# Patient Record
Sex: Male | Born: 1985 | Race: White | Hispanic: No | Marital: Married
Health system: Midwestern US, Community
[De-identification: ages and names within clinical notes are randomized; demographics above are authoritative.]

## PROBLEM LIST (undated history)

## (undated) DIAGNOSIS — M25562 Pain in left knee: Secondary | ICD-10-CM

## (undated) DIAGNOSIS — G8929 Other chronic pain: Secondary | ICD-10-CM

## (undated) DIAGNOSIS — M25561 Pain in right knee: Secondary | ICD-10-CM

## (undated) DIAGNOSIS — R52 Pain, unspecified: Secondary | ICD-10-CM

## (undated) DIAGNOSIS — M25511 Pain in right shoulder: Secondary | ICD-10-CM

---

## 2017-03-11 ENCOUNTER — Emergency Department: Admit: 2017-03-12 | Payer: BLUE CROSS/BLUE SHIELD | Primary: Family

## 2017-03-11 DIAGNOSIS — J101 Influenza due to other identified influenza virus with other respiratory manifestations: Secondary | ICD-10-CM

## 2017-03-11 NOTE — ED Provider Notes (Signed)
[facility]     Department of Emergency Medicine   ED  Provider Note  Admit Date/RoomTime: 03/11/2017  9:19 PM  ED Room: 30/30   Chief Complaint:   Cough and Fever    History of Present Illness   Source of history provided by:  patient.  History/Exam Limitations: none.       Henry Edwards is a 32 y.o. old male who has a past medical Hx of: History reviewed. No pertinent past medical history. presents to the emergency department by private vehicle, for fever, which began 2 day(s) prior to arrival.  The fever is described as: fevers up to 101 degrees and measured orally.  Since onset the symptoms have been persistent. His symptoms are associated with cough, muscle aches, nasal congestion and low back pain. There has been NO history of abdominal pain, appetite decrease, chest pain, dysuria, neck stiffness, rash or vomiting. He has been treated with acetaminophen prior to arrival, 5 hours.    .  ROS    Pertinent positives and negatives are stated within HPI, all other systems reviewed and are negative.    Past Surgical History:   Procedure Laterality Date   . SHOULDER ARTHROPLASTY Left    Social History:  reports that he has been smoking Cigarettes.  He has been smoking about 1.50 packs per day. He has never used smokeless tobacco.  Family History: family history is not on file.  Allergies: Patient has no known allergies.    Physical Exam           ED Triage Vitals [03/11/17 2116]   BP Temp Temp Source Pulse Resp SpO2 Height Weight   (!) 141/75 101.8 F (38.8 C) Oral 100 18 96 % 5\' 10"  (1.778 m) 160 lb (72.6 kg)     Oxygen Saturation Interpretation: Normal.    Constitutional:  Alert, development consistent with age.  HEENT:  NC/NT.  Airway patent.  Neck:  Normal ROM.  Supple.  Respiratory:  Clear to auscultation and breath sounds equal.  CV:  Regular rate and rhythm, normal heart sounds, without pathological murmurs, ectopy, gallops, or rubs.  GI: Abdomen Soft, nontender, good bowel sounds.  No firm or pulsatile  mass.  Back:  No costovertebral tenderness.  Integument:  Normal turgor.  Warm, dry, without visible rash, unless noted elsewhere.  Lymphatic: no lymphadenopathy noted  Neurological:  Oriented.  Motor functions intact.    Lab / Imaging Results   (All laboratory and radiology results have been personally reviewed by myself)  Labs:  Results for orders placed or performed during the hospital encounter of 03/11/17   Rapid influenza A/B antigens   Result Value Ref Range    Influenza A by PCR DETECTED (A) Not Detected    Influenza B by PCR Not Detected Not Detected   Urinalysis   Result Value Ref Range    Color, UA Yellow Straw/Yellow    Clarity, UA Clear Clear    Glucose, Ur Negative Negative mg/dL    Bilirubin Urine Negative Negative    Ketones, Urine Negative Negative mg/dL    Specific Gravity, UA >=1.030 1.005 - 1.030    Blood, Urine Negative Negative    pH, UA 5.5 5.0 - 9.0    Protein, UA Negative Negative mg/dL    Urobilinogen, Urine 0.2 <2.0 E.U./dL    Nitrite, Urine Negative Negative    Leukocyte Esterase, Urine Negative Negative     Imaging:  All Radiology results interpreted by Radiologist unless otherwise noted.  XR CHEST STANDARD (2 VW)   Final Result          ED Course / Medical Decision Making     Medications   ibuprofen (ADVIL;MOTRIN) tablet 800 mg (800 mg Oral Given 03/11/17 2129)        Re-Evaluations:  03/11/17      Time: 2200    Patient's symptoms are improving, reprots the motrin helped a little    Consultations:             None    Procedures:   none    MDM:  PT presents with fever and body aches for 2 days. No flu shot this year, mild cough. Positive flu A swab, Advised pt of progress of disease, rest and fluid intake, motrin and tylenol    Counseling:   I have spoken with the patient and discussed today's results, in addition to providing specific details for the plan of care and counseling regarding the diagnosis and prognosis and are agreeable with the plan.     Assessment      1. Influenza A New  Problem     This patient's ED course included: a personal history and physicial examination and re-evaluation prior to disposition    This patient has remained hemodynamically stable during their ED course.     Plan   Discharge to home.  Patient condition is stable.    New Medications     New Prescriptions    No medications on file     Electronically signed by Shelly FlattenLiza J Lakea Mittelman, PA-C   DD: 03/11/17  **This report was transcribed using voice recognition software. Every effort was made to ensure accuracy; however, inadvertent computerized transcription errors may be present.  END OF PROVIDER NOTE         Shelly FlattenLiza J Schuyler Behan, PA-C  03/11/17 2220

## 2017-03-12 ENCOUNTER — Inpatient Hospital Stay
Admit: 2017-03-12 | Discharge: 2017-03-12 | Disposition: A | Discharge status: Home / Self Care | Payer: BLUE CROSS/BLUE SHIELD

## 2017-03-12 LAB — URINALYSIS
Bilirubin Urine: NEGATIVE
Blood, Urine: NEGATIVE
Glucose, Ur: NEGATIVE mg/dL
Ketones, Urine: NEGATIVE mg/dL
Leukocyte Esterase, Urine: NEGATIVE
Nitrite, Urine: NEGATIVE
Protein, UA: NEGATIVE mg/dL
Specific Gravity, UA: >=1.03 (ref 1.005–1.030)
Urobilinogen, Urine: 0.2 E.U./dL (ref <2.0)
pH, UA: 5.5 (ref 5.0–9.0)

## 2017-03-12 LAB — RAPID INFLUENZA A/B ANTIGENS
Influenza A by PCR: DETECTED — AB
Influenza B by PCR: NOT DETECTED

## 2017-03-12 MED ORDER — IBUPROFEN 800 MG PO TABS
800 MG | Freq: Once | ORAL | Status: AC
Start: 2017-03-12 — End: 2017-03-11
  Administered 2017-03-12: 02:00:00 800 mg via ORAL

## 2017-03-12 MED FILL — IBUPROFEN 800 MG PO TABS: 800 MG | ORAL | Qty: 1

## 2017-10-01 ENCOUNTER — Emergency Department: Admit: 2017-10-01 | Payer: BLUE CROSS/BLUE SHIELD | Primary: Family

## 2017-10-01 ENCOUNTER — Inpatient Hospital Stay
Admit: 2017-10-01 | Discharge: 2017-10-02 | Disposition: A | Discharge status: Home / Self Care | Payer: BLUE CROSS/BLUE SHIELD | Attending: Emergency Medicine

## 2017-10-01 ENCOUNTER — Emergency Department: Admit: 2017-10-02 | Payer: BLUE CROSS/BLUE SHIELD | Primary: Family

## 2017-10-01 DIAGNOSIS — J40 Bronchitis, not specified as acute or chronic: Secondary | ICD-10-CM

## 2017-10-01 MED ORDER — ACETAMINOPHEN 500 MG PO TABS
500 MG | Freq: Once | ORAL | Status: AC
Start: 2017-10-01 — End: 2017-10-01
  Administered 2017-10-02: 1000 mg via ORAL

## 2017-10-01 MED ORDER — SODIUM CHLORIDE 0.9% BOLUS (FLUID RESUSCITATION)
0.9 % | Freq: Once | INTRAVENOUS | Status: AC
Start: 2017-10-01 — End: 2017-10-01
  Administered 2017-10-02: 1000 mL via INTRAVENOUS

## 2017-10-01 NOTE — ED Notes (Signed)
Assumed care of patient. Pt lying in bed in no apparent distress. Wife at bedside.     Bethanne GingerJanice Eddrick Dilone, RN  10/01/17 2005

## 2017-10-01 NOTE — ED Notes (Signed)
Bed: 02  Expected date:   Expected time:   Means of arrival:   Comments:  Sharlot GowdaWilson     Cenia Zaragosa A Nijel Flink, RN  10/01/17 [phone]

## 2017-10-01 NOTE — ED Triage Notes (Signed)
Pt arrived in ED with c/o chest pain throughout chest into ribs, lower back, kidneys, SOB with exertion, dry non productive cough, fever onset this morning with sweating and chills onset last night. Denies frequency, urgency, dysuria, diarrhea, constipation.  States his family has just gotten over a cold recently. Pt tried geneic nyquil and dayquil without relief. Wife at bedside.

## 2017-10-01 NOTE — ED Notes (Signed)
Bed: HALL-04  Expected date:   Expected time:   Means of arrival:   Comments:  Harrell GaveDeb     Jaidence Geisler, RN  10/01/17 [phone]

## 2017-10-01 NOTE — ED Provider Notes (Signed)
ED Attending  CC: No       Department of Emergency Medicine   ED  Provider Note  Admit Date/RoomTime: 10/01/2017  6:59 PM  ED Room: 02/02   Chief Complaint:   Chest Pain (Throughout chest into ribs. Pain present since waking up this morning. + Non productive Cough onset today. ) and Shortness of Breath (With exertion)    History of Present Illness   Source of history provided by:  patient.  History/Exam Limitations: none.       Henry Edwards is a 32 y.o. old male who has a past medical Hx of:   Past Medical History:   Diagnosis Date   . Rotator cuff disorder, left     presents to the emergency department ambulatory, for fever, which began 1 day(s) prior to arrival.  The fever is described as: sweats and chills.  Since onset the symptoms have been persistent. His symptoms are associated with abdominal pain. He has been treated with nothing prior to arrival.      ROS    Pertinent positives and negatives are stated within HPI, all other systems reviewed and are negative.    Past Surgical History:   Procedure Laterality Date   . SHOULDER ARTHROPLASTY Left    Social History:  reports that he has been smoking cigarettes. He has been smoking about 1.50 packs per day. He has never used smokeless tobacco. He reports that he drank alcohol. He reports that he does not use drugs.  Family History: family history is not on file.  Allergies: Patient has no known allergies.    Physical Exam           ED Triage Vitals [10/01/17 1845]   BP Temp Temp Source Pulse Resp SpO2 Height Weight   122/78 101.6 F (38.7 C) Oral 101 16 99 % 5\' 10"  (1.778 m) 170 lb (77.1 kg)     Oxygen Saturation Interpretation: Normal.    Constitutional:  Alert, development consistent with age.  HEENT:  NC/NT.  Airway patent.  Neck:  Normal ROM.  Supple.  Respiratory:  Clear to auscultation and breath sounds equal.  CV:  Tachycardia, normal heart sounds, without pathological murmurs, ectopy, gallops, or rubs.  GI: Abdomen Soft, LLQ tenderness to palpation,  good bowel sounds.  Back:  No costovertebral tenderness.  Integument:  Normal turgor.  Warm, dry, without visible rash, unless noted elsewhere.  Lymphatic: no lymphadenopathy noted  Neurological:  Oriented.  Motor functions intact.    Lab / Imaging Results   (All laboratory and radiology results have been personally reviewed by myself)  Labs:  Results for orders placed or performed during the hospital encounter of 10/01/17   CBC Auto Differential   Result Value Ref Range    WBC 7.4 4.5 - 11.5 E9/L    RBC 4.42 3.80 - 5.80 E12/L    Hemoglobin 13.9 12.5 - 16.5 g/dL    Hematocrit 86.5 78.4 - 54.0 %    MCV 88.5 80.0 - 99.9 fL    MCH 31.4 26.0 - 35.0 pg    MCHC 35.5 (H) 32.0 - 34.5 %    RDW 12.2 11.5 - 15.0 fL    Platelets 203 130 - 450 E9/L    MPV 8.9 7.0 - 12.0 fL    Neutrophils % 75.2 43.0 - 80.0 %    Immature Granulocytes % 0.3 0.0 - 5.0 %    Lymphocytes % 16.3 (L) 20.0 - 42.0 %    Monocytes % 8.0  2.0 - 12.0 %    Eosinophils % 0.1 0.0 - 6.0 %    Basophils % 0.1 0.0 - 2.0 %    Neutrophils # 5.57 1.80 - 7.30 E9/L    Immature Granulocytes # 0.02 E9/L    Lymphocytes # 1.21 (L) 1.50 - 4.00 E9/L    Monocytes # 0.59 0.10 - 0.95 E9/L    Eosinophils # 0.01 (L) 0.05 - 0.50 E9/L    Basophils # 0.01 0.00 - 0.20 E9/L   Comprehensive Metabolic Panel w/ Reflex to MG   Result Value Ref Range    Sodium 136 132 - 146 mmol/L    Potassium reflex Magnesium 3.8 3.5 - 5.0 mmol/L    Chloride 101 98 - 107 mmol/L    CO2 23 22 - 29 mmol/L    Anion Gap 12 7 - 16 mmol/L    Glucose 99 74 - 99 mg/dL    BUN 14 6 - 20 mg/dL    CREATININE 0.8 0.7 - 1.2 mg/dL    GFR Non-African American >60 >=60 mL/min/1.73    GFR African American >60     Calcium 8.8 8.6 - 10.2 mg/dL    Total Protein 7.2 6.4 - 8.3 g/dL    Alb 4.4 3.5 - 5.2 g/dL    Total Bilirubin 1.4 (H) 0.0 - 1.2 mg/dL    Alkaline Phosphatase 72 40 - 129 U/L    ALT 24 0 - 40 U/L    AST 26 0 - 39 U/L   Lipase   Result Value Ref Range    Lipase 28 13 - 60 U/L   Troponin   Result Value Ref Range     Troponin <0.01 0.00 - 0.03 ng/mL   D-Dimer, Quantitative   Result Value Ref Range    D-Dimer, Quant <200 ng/mL DDU   Urinalysis, reflex to microscopic   Result Value Ref Range    Color, UA Yellow Straw/Yellow    Clarity, UA Clear Clear    Glucose, Ur Negative Negative mg/dL    Bilirubin Urine Negative Negative    Ketones, Urine Negative Negative mg/dL    Specific Gravity, UA 1.020 1.005 - 1.030    Blood, Urine TRACE-LYSED Negative    pH, UA 6.0 5.0 - 9.0    Protein, UA Negative Negative mg/dL    Urobilinogen, Urine 1.0 <2.0 E.U./dL    Nitrite, Urine Negative Negative    Leukocyte Esterase, Urine Negative Negative   Lactic Acid, Plasma   Result Value Ref Range    Lactic Acid 0.6 0.5 - 2.2 mmol/L   Microscopic Urinalysis   Result Value Ref Range    WBC, UA NONE 0 - 5 /HPF    RBC, UA 0-1 0 - 2 /HPF    Bacteria, UA NONE /HPF   EKG 12 Lead   Result Value Ref Range    Ventricular Rate 97 BPM    Atrial Rate 97 BPM    P-R Interval 132 ms    QRS Duration 78 ms    Q-T Interval 316 ms    QTc Calculation (Bazett) 401 ms    P Axis 9 degrees    R Axis 19 degrees    T Axis 17 degrees     Imaging:  All Radiology results interpreted by Radiologist unless otherwise noted.  CT ABDOMEN PELVIS W IV CONTRAST Additional Contrast? None   Final Result      NO ACUTE PATHOLOGY SEEN IN ABDOMEN OR PELVIS  Right anterior lower pelvis extraperitoneal soft tissue density   suggesting of possible fat necrosis possibly from trauma.      Diverticulosis with no evidence of diverticulitis.         XR CHEST STANDARD (2 VW)   Final Result   NO ACUTE CARDIOPULMONARY PROCESS              ED Course / Medical Decision Making     Medications   0.9 % sodium chloride IV bolus 1,000 mL (0 mLs Intravenous Stopped 10/01/17 2302)   acetaminophen (TYLENOL) tablet 1,000 mg (1,000 mg Oral Given 10/01/17 2013)   iopamidol (ISOVUE-370) 76 % injection 110 mL (110 mLs Intravenous Given 10/01/17 2102)   sodium chloride flush 0.9 % injection 10 mL (10 mLs Intravenous  Given 10/01/17 2102)          Consultations:             None    Procedures:   none    MDM: Patient was evaluated by Dr. Margretta Ditty.  Labs were unremarkable.  UA was negative.  Chest x-ray was negative.  CT was unremarkable.  He is well-appearing in NAD and reports symptom improvement.  He should follow-up with his PCP within 3 days and return to the ER if symptoms worsen.      Counseling:   I have spoken with the patient and discussed today's results, in addition to providing specific details for the plan of care and counseling regarding the diagnosis and prognosis and are agreeable with the plan.     Assessment      1. Bronchitis    2. Cough      This patient's ED course included: a personal history and physicial examination and re-evaluation prior to disposition    This patient has remained hemodynamically stable and improved during their ED course.     Plan   Discharge to home.  Patient condition is stable.    New Medications     New Prescriptions    BENZONATATE (TESSALON PERLES) 100 MG CAPSULE    Take 1 capsule by mouth 3 times daily as needed for Cough    DOXYCYCLINE HYCLATE (VIBRA-TABS) 100 MG TABLET    Take 1 tablet by mouth 2 times daily for 7 days     Electronically signed by Artist Beach, APRN - CNP   DD: 10/01/17  **This report was transcribed using voice recognition software. Every effort was made to ensure accuracy; however, inadvertent computerized transcription errors may be present.     Artist Beach, APRN - CNP  10/01/17 2316

## 2017-10-01 NOTE — ED Notes (Signed)
Discharge instructions given and reviewed with patient and wife. RXs given. Instructed to follow up with CBroderik, NP. Questions and concerns addressed. Pt departed ED ambulatory in no apparent distress with wife. Personal belongings taken.     Bethanne Ginger, RN  10/01/17 2337

## 2017-10-02 LAB — CBC WITH AUTO DIFFERENTIAL
Basophils %: 0.1 % (ref 0.0–2.0)
Basophils Absolute: 0.01 E9/L (ref 0.00–0.20)
Eosinophils %: 0.1 % (ref 0.0–6.0)
Eosinophils Absolute: 0.01 E9/L — ABNORMAL LOW (ref 0.05–0.50)
Hematocrit: 39.1 % (ref 37.0–54.0)
Hemoglobin: 13.9 g/dL (ref 12.5–16.5)
Immature Granulocytes #: 0.02 E9/L
Immature Granulocytes %: 0.3 % (ref 0.0–5.0)
Lymphocytes %: 16.3 % — ABNORMAL LOW (ref 20.0–42.0)
Lymphocytes Absolute: 1.21 E9/L — ABNORMAL LOW (ref 1.50–4.00)
MCH: 31.4 pg (ref 26.0–35.0)
MCHC: 35.5 % — ABNORMAL HIGH (ref 32.0–34.5)
MCV: 88.5 fL (ref 80.0–99.9)
MPV: 8.9 fL (ref 7.0–12.0)
Monocytes %: 8 % (ref 2.0–12.0)
Monocytes Absolute: 0.59 E9/L (ref 0.10–0.95)
Neutrophils %: 75.2 % (ref 43.0–80.0)
Neutrophils Absolute: 5.57 E9/L (ref 1.80–7.30)
Platelets: 203 E9/L (ref 130–450)
RBC: 4.42 E12/L (ref 3.80–5.80)
RDW: 12.2 fL (ref 11.5–15.0)
WBC: 7.4 E9/L (ref 4.5–11.5)

## 2017-10-02 LAB — EKG 12-LEAD
Atrial Rate: 97 {beats}/min
P Axis: 9 degrees
P-R Interval: 132 ms
Q-T Interval: 316 ms
QRS Duration: 78 ms
QTc Calculation (Bazett): 401 ms
R Axis: 19 degrees
T Axis: 17 degrees
Ventricular Rate: 97 {beats}/min

## 2017-10-02 LAB — LACTIC ACID: Lactic Acid: 0.6 mmol/L (ref 0.5–2.2)

## 2017-10-02 LAB — COMPREHENSIVE METABOLIC PANEL W/ REFLEX TO MG FOR LOW K
ALT: 24 U/L (ref 0–40)
AST: 26 U/L (ref 0–39)
Albumin: 4.4 g/dL (ref 3.5–5.2)
Alkaline Phosphatase: 72 U/L (ref 40–129)
Anion Gap: 12 mmol/L (ref 7–16)
BUN: 14 mg/dL (ref 6–20)
CO2: 23 mmol/L (ref 22–29)
Calcium: 8.8 mg/dL (ref 8.6–10.2)
Chloride: 101 mmol/L (ref 98–107)
Creatinine: 0.8 mg/dL (ref 0.7–1.2)
GFR African American: >60
GFR Non-African American: >60 mL/min/{1.73_m2} (ref >=60)
Glucose: 99 mg/dL (ref 74–99)
Potassium reflex Magnesium: 3.8 mmol/L (ref 3.5–5.0)
Sodium: 136 mmol/L (ref 132–146)
Total Bilirubin: 1.4 mg/dL — ABNORMAL HIGH (ref 0.0–1.2)
Total Protein: 7.2 g/dL (ref 6.4–8.3)

## 2017-10-02 LAB — D-DIMER, QUANTITATIVE: D-Dimer, Quant: <200 ng/mL DDU

## 2017-10-02 LAB — URINALYSIS
Bilirubin Urine: NEGATIVE
Glucose, Ur: NEGATIVE mg/dL
Ketones, Urine: NEGATIVE mg/dL
Leukocyte Esterase, Urine: NEGATIVE
Nitrite, Urine: NEGATIVE
Protein, UA: NEGATIVE mg/dL
Specific Gravity, UA: 1.02 (ref 1.005–1.030)
Urobilinogen, Urine: 1 E.U./dL (ref <2.0)
pH, UA: 6 (ref 5.0–9.0)

## 2017-10-02 LAB — LIPASE: Lipase: 28 U/L (ref 13–60)

## 2017-10-02 LAB — MICROSCOPIC URINALYSIS

## 2017-10-02 LAB — TROPONIN: Troponin: <0.01 ng/mL (ref 0.00–0.03)

## 2017-10-02 MED ORDER — NORMAL SALINE FLUSH 0.9 % IV SOLN
0.9 % | Freq: Once | INTRAVENOUS | Status: AC
Start: 2017-10-02 — End: 2017-10-01
  Administered 2017-10-02: 01:00:00 10 mL via INTRAVENOUS

## 2017-10-02 MED ORDER — DOXYCYCLINE HYCLATE 100 MG PO TABS
100 MG | ORAL_TABLET | Freq: Two times a day (BID) | ORAL | 0 refills | Status: AC
Start: 2017-10-02 — End: 2017-10-08

## 2017-10-02 MED ORDER — BENZONATATE 100 MG PO CAPS
100 MG | ORAL_CAPSULE | Freq: Three times a day (TID) | ORAL | 0 refills | Status: AC | PRN
Start: 2017-10-02 — End: 2017-10-08

## 2017-10-02 MED ORDER — IOPAMIDOL 76 % IV SOLN
76 % | Freq: Once | INTRAVENOUS | Status: AC | PRN
Start: 2017-10-02 — End: 2017-10-01
  Administered 2017-10-02: 01:00:00 110 mL via INTRAVENOUS

## 2017-10-02 MED FILL — ACETAMINOPHEN EXTRA STRENGTH 500 MG PO TABS: 500 MG | ORAL | Qty: 2

## 2017-10-07 LAB — CULTURE BLOOD #1: Blood Culture, Routine: 5

## 2017-10-07 LAB — CULTURE, BLOOD 2: Culture, Blood 2: 5

## 2020-09-14 ENCOUNTER — Encounter

## 2020-10-05 ENCOUNTER — Inpatient Hospital Stay: Admit: 2020-10-05 | Payer: BLUE CROSS/BLUE SHIELD | Primary: Family

## 2020-10-05 DIAGNOSIS — M25561 Pain in right knee: Secondary | ICD-10-CM

## 2020-10-29 ENCOUNTER — Inpatient Hospital Stay
Admit: 2020-10-29 | Discharge: 2020-10-29 | Disposition: A | Discharge status: Home / Self Care | Payer: BLUE CROSS/BLUE SHIELD | Attending: Emergency Medicine

## 2020-10-29 ENCOUNTER — Emergency Department: Admit: 2020-10-29 | Payer: BLUE CROSS/BLUE SHIELD | Primary: Family

## 2020-10-29 DIAGNOSIS — K529 Noninfective gastroenteritis and colitis, unspecified: Secondary | ICD-10-CM

## 2020-10-29 LAB — BASIC METABOLIC PANEL W/ REFLEX TO MG FOR LOW K
Anion Gap: 9 mmol/L (ref 7–16)
BUN: 16 mg/dL (ref 6–20)
CO2: 25 mmol/L (ref 22–29)
Calcium: 9.6 mg/dL (ref 8.6–10.2)
Chloride: 104 mmol/L (ref 98–107)
Creatinine: 0.8 mg/dL (ref 0.7–1.2)
GFR African American: >60
GFR Non-African American: >60 mL/min/{1.73_m2} (ref >=60)
Glucose: 110 mg/dL — ABNORMAL HIGH (ref 74–99)
Potassium reflex Magnesium: 4.1 mmol/L (ref 3.5–5.0)
Sodium: 138 mmol/L (ref 132–146)

## 2020-10-29 LAB — CBC WITH AUTO DIFFERENTIAL
Basophils %: 0.1 % (ref 0.0–2.0)
Basophils Absolute: 0.01 E9/L (ref 0.00–0.20)
Eosinophils %: 0.6 % (ref 0.0–6.0)
Eosinophils Absolute: 0.06 E9/L (ref 0.05–0.50)
Hematocrit: 45.9 % (ref 37.0–54.0)
Hemoglobin: 15.5 g/dL (ref 12.5–16.5)
Immature Granulocytes #: 0.03 E9/L
Immature Granulocytes %: 0.3 % (ref 0.0–5.0)
Lymphocytes %: 14 % — ABNORMAL LOW (ref 20.0–42.0)
Lymphocytes Absolute: 1.44 E9/L — ABNORMAL LOW (ref 1.50–4.00)
MCH: 30.3 pg (ref 26.0–35.0)
MCHC: 33.8 % (ref 32.0–34.5)
MCV: 89.8 fL (ref 80.0–99.9)
MPV: 8.6 fL (ref 7.0–12.0)
Monocytes %: 5.1 % (ref 2.0–12.0)
Monocytes Absolute: 0.53 E9/L (ref 0.10–0.95)
Neutrophils %: 79.9 % (ref 43.0–80.0)
Neutrophils Absolute: 8.24 E9/L — ABNORMAL HIGH (ref 1.80–7.30)
Platelets: 288 E9/L (ref 130–450)
RBC: 5.11 E12/L (ref 3.80–5.80)
RDW: 12.7 fL (ref 11.5–15.0)
WBC: 10.3 E9/L (ref 4.5–11.5)

## 2020-10-29 LAB — HEPATIC FUNCTION PANEL
ALT: 15 U/L (ref 0–40)
AST: 19 U/L (ref 0–39)
Albumin: 4.9 g/dL (ref 3.5–5.2)
Alkaline Phosphatase: 84 U/L (ref 40–129)
Bilirubin, Direct: 0.2 mg/dL (ref 0.0–0.3)
Bilirubin, Indirect: 1.3 mg/dL — ABNORMAL HIGH (ref 0.0–1.0)
Total Bilirubin: 1.5 mg/dL — ABNORMAL HIGH (ref 0.0–1.2)
Total Protein: 7.8 g/dL (ref 6.4–8.3)

## 2020-10-29 LAB — LACTIC ACID: Lactic Acid: 1.1 mmol/L (ref 0.5–2.2)

## 2020-10-29 LAB — CLOSTRIDIUM DIFFICILE EIA: C.diff Toxin/Antigen: NOT DETECTED

## 2020-10-29 MED ORDER — SODIUM CHLORIDE 0.9 % IV BOLUS
0.9 % | Freq: Once | INTRAVENOUS | Status: AC
Start: 2020-10-29 — End: 2020-10-29
  Administered 2020-10-29: 14:00:00 1000 mL via INTRAVENOUS

## 2020-10-29 MED ORDER — ONDANSETRON 4 MG PO TBDP
4 MG | ORAL_TABLET | Freq: Three times a day (TID) | ORAL | 0 refills | Status: DC | PRN
Start: 2020-10-29 — End: 2020-10-29

## 2020-10-29 MED ORDER — IOPAMIDOL 76 % IV SOLN
76 % | Freq: Once | INTRAVENOUS | Status: AC | PRN
Start: 2020-10-29 — End: 2020-10-29
  Administered 2020-10-29: 14:00:00 75 mL via INTRAVENOUS

## 2020-10-29 MED ORDER — ONDANSETRON 4 MG PO TBDP
4 MG | ORAL_TABLET | Freq: Three times a day (TID) | ORAL | 0 refills | Status: AC | PRN
Start: 2020-10-29 — End: ?

## 2020-10-29 NOTE — ED Provider Notes (Signed)
HPI     Patient is a 35 y.o. male presents with a chief complaint of diarrhea.  Patient states for the last 28 hours he has had over "100" episodes of diarrhea.  He states he had 1 episode of vomiting this morning but that could be due to other people vomiting in the waiting room.  He states that there is no pain just a bloating sensation.  This has been occurring for 28 hours.  Patient states that it gets better with nothing.  Patient states that it gets worse with nothing.  Patient states that it is severe in severity.  Patient states it was acute in onset.      Review of Systems   Constitutional:  Positive for activity change, appetite change and fatigue. Negative for chills and fever.   HENT:  Negative for congestion and sore throat.    Eyes:  Negative for visual disturbance.   Respiratory:  Negative for apnea, cough, chest tightness, shortness of breath and wheezing.    Cardiovascular:  Negative for chest pain.   Gastrointestinal:  Positive for abdominal pain, diarrhea and vomiting. Negative for abdominal distention, constipation and nausea.   Endocrine: Negative for polyuria.   Genitourinary:  Negative for decreased urine volume, dysuria and urgency.   Musculoskeletal:  Negative for back pain.   Skin:  Negative for rash.   Neurological:  Negative for dizziness, weakness, light-headedness, numbness and headaches.      Physical Exam  Vitals and nursing note reviewed.   Constitutional:       General: He is not in acute distress.     Appearance: Normal appearance. He is not ill-appearing, toxic-appearing or diaphoretic.   HENT:      Head: Normocephalic and atraumatic.      Right Ear: External ear normal.      Left Ear: External ear normal.      Nose: Nose normal.      Mouth/Throat:      Mouth: Mucous membranes are moist.      Pharynx: No oropharyngeal exudate or posterior oropharyngeal erythema.   Eyes:      General: No scleral icterus.        Right eye: No discharge.         Left eye: No discharge.    Cardiovascular:      Rate and Rhythm: Normal rate and regular rhythm.      Heart sounds: No murmur heard.    No friction rub. No gallop.   Pulmonary:      Effort: Pulmonary effort is normal. No respiratory distress.      Breath sounds: No stridor. No wheezing, rhonchi or rales.   Abdominal:      General: Abdomen is flat. There is distension.      Tenderness: There is no abdominal tenderness. There is no right CVA tenderness, left CVA tenderness, guarding or rebound.   Musculoskeletal:         General: No swelling, tenderness or deformity.   Skin:     General: Skin is warm.      Capillary Refill: Capillary refill takes less than 2 seconds.      Coloration: Skin is not jaundiced.   Neurological:      General: No focal deficit present.      Mental Status: He is alert.   Psychiatric:         Mood and Affect: Mood normal.        Procedures     MDM  Patient  appeared to have a distended abdomen.  While he did not have any abdominal pain contrast CT of the abdomen was ordered to rule out any possible abdominal pathology.  Appendix was visualized normally.  There is no evidence of gallstones.  There was fluid within the small bowel and colon which is most likely due to to gastroenteritis.  Patient was also given a C. difficile test to rule out C. difficile.  Patient will be notified of results when received.  Patient was given prescription for Zofran and told to maintain adequate fluid intake.  Patient was agreeable to this plan.      Patient is a 35 y.o. male presenting with abdominal pain, diarrhea.      Patient was given return precautions. Labs were interpreted by me. Patient will follow up with their primary care provider. Patient is agreeable to this plan. Patient has remained stable throughout their stay in the ED.       Patient was seen and evaluated by myself and my attending Winn Jock, DO. Assessment and Plan discussed with attending provider, please see attestation for final plan of care.  This note was  done using dictation software and there may be some grammatical errors associated with this.    Liana Gerold, DO           --------------------------------------------- PAST HISTORY ---------------------------------------------  Past Medical History:  has a past medical history of Rotator cuff disorder, left.    Past Surgical History:  has a past surgical history that includes Total shoulder arthroplasty (Left).    Social History:  reports that he has been smoking cigarettes. He has been smoking an average of 1.5 packs per day. He has never used smokeless tobacco. He reports that he does not currently use alcohol. He reports that he does not use drugs.    Family History: family history is not on file.     The patient???s home medications have been reviewed.    Allergies: Patient has no known allergies.    -------------------------------------------------- RESULTS -------------------------------------------------  Labs:  Results for orders placed or performed during the hospital encounter of 10/29/20   CLOSTRIDIUM DIFFICILE EIA    Specimen: Stool   Result Value Ref Range    C.diff Toxin/Antigen       C. Difficile Toxin A and/or B NOT detected  Normal Range: Not detected     CBC with Auto Differential   Result Value Ref Range    WBC 10.3 4.5 - 11.5 E9/L    RBC 5.11 3.80 - 5.80 E12/L    Hemoglobin 15.5 12.5 - 16.5 g/dL    Hematocrit 36.6 44.0 - 54.0 %    MCV 89.8 80.0 - 99.9 fL    MCH 30.3 26.0 - 35.0 pg    MCHC 33.8 32.0 - 34.5 %    RDW 12.7 11.5 - 15.0 fL    Platelets 288 130 - 450 E9/L    MPV 8.6 7.0 - 12.0 fL    Neutrophils % 79.9 43.0 - 80.0 %    Immature Granulocytes % 0.3 0.0 - 5.0 %    Lymphocytes % 14.0 (L) 20.0 - 42.0 %    Monocytes % 5.1 2.0 - 12.0 %    Eosinophils % 0.6 0.0 - 6.0 %    Basophils % 0.1 0.0 - 2.0 %    Neutrophils Absolute 8.24 (H) 1.80 - 7.30 E9/L    Immature Granulocytes # 0.03 E9/L    Lymphocytes Absolute 1.44 (L) 1.50 - 4.00 E9/L  Monocytes Absolute 0.53 0.10 - 0.95 E9/L    Eosinophils  Absolute 0.06 0.05 - 0.50 E9/L    Basophils Absolute 0.01 0.00 - 0.20 E9/L   Basic Metabolic Panel w/ Reflex to MG   Result Value Ref Range    Sodium 138 132 - 146 mmol/L    Potassium reflex Magnesium 4.1 3.5 - 5.0 mmol/L    Chloride 104 98 - 107 mmol/L    CO2 25 22 - 29 mmol/L    Anion Gap 9 7 - 16 mmol/L    Glucose 110 (H) 74 - 99 mg/dL    BUN 16 6 - 20 mg/dL    Creatinine 0.8 0.7 - 1.2 mg/dL    GFR Non-African American >60 >=60 mL/min/1.73    GFR African American >60     Calcium 9.6 8.6 - 10.2 mg/dL   Hepatic Function Panel   Result Value Ref Range    Total Protein 7.8 6.4 - 8.3 g/dL    Albumin 4.9 3.5 - 5.2 g/dL    Alkaline Phosphatase 84 40 - 129 U/L    ALT 15 0 - 40 U/L    AST 19 0 - 39 U/L    Total Bilirubin 1.5 (H) 0.0 - 1.2 mg/dL    Bilirubin, Direct 0.2 0.0 - 0.3 mg/dL    Bilirubin, Indirect 1.3 (H) 0.0 - 1.0 mg/dL   Lactic Acid   Result Value Ref Range    Lactic Acid 1.1 0.5 - 2.2 mmol/L       Radiology:  CT ABDOMEN PELVIS W IV CONTRAST Additional Contrast? None   Final Result   Nonspecific fluid within the small bowel and colon could be secondary to   gastroenteritis.             ------------------------- NURSING NOTES AND VITALS REVIEWED ---------------------------  Date / Time Roomed:  10/29/2020  7:18 AM  ED Bed Assignment:  OTF/OTF    The nursing notes within the ED encounter and vital signs as below have been reviewed.   BP (!) 158/88    Pulse 73    Temp 98.2 ??F (36.8 ??C) (Oral)    Resp 18    Ht 5\' 11"  (1.803 m)    Wt 170 lb (77.1 kg)    SpO2 100%    BMI 23.71 kg/m??   Oxygen Saturation Interpretation: Normal      ------------------------------------------ PROGRESS NOTES ------------------------------------------  11:09 AM EDT  I have spoken with the patient and family if present and discussed today???s results, in addition to providing specific details for the plan of care and counseling regarding the diagnosis and prognosis.  Their questions are answered at this time and they are agreeable with the  plan. I discussed at length with them reasons for immediate return here for re evaluation. They will followup with their primary care provider by calling their office as soon as possible.      --------------------------------- ADDITIONAL PROVIDER NOTES ---------------------------------  At this time the patient is without objective evidence of an acute process requiring hospitalization or inpatient management.  They have remained hemodynamically stable throughout their entire ED visit and are stable for discharge with outpatient follow-up.     The plan has been discussed in detail and they are aware of the specific conditions for emergent return, as well as the importance of follow-up.      Discharge Medication List as of 10/29/2020 10:44 AM      Zofran ODT 4mg     Diagnosis:  1. Gastroenteritis  Disposition:  Patient's disposition: Discharge to home  Patient's condition is stable.      Liana Gerold, DO  Resident  10/29/20 1109       Liana Gerold, DO  Resident  10/29/20 [phone]

## 2021-02-21 IMAGING — MR MRI TIB/FIB LT WO CONTRAST
19 of 21 series · 38 of 40 positions shown · non-contrast
Comparison: none

﻿Pertinent Hx:  Fell 01/29/2021.  Knee pain and leg pain.  doi 12.20.22
TECHNIQUE: Images were taken in axial, coronal and sagittal planes.  T1 and T2-weighted imaging was performed.

[Series 5: PD fat-sat · coronal · 5.0mm · 0.73mm/px · 1 of 24 slices shown (1 of 8)]
[im 1/24]
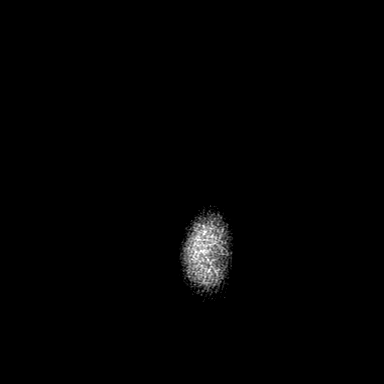

[Series 6: PD fat-sat · coronal · 5.0mm · 0.88mm/px · 1 of 24 slices shown (2 of 8)]
[im 1/24]
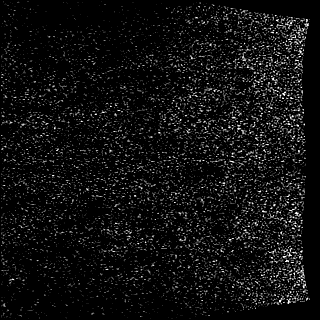

[Series 7: T2 · coronal · 5.0mm · 0.88mm/px · 1 of 24 slices shown (1 of 4)]
[im 1/24]
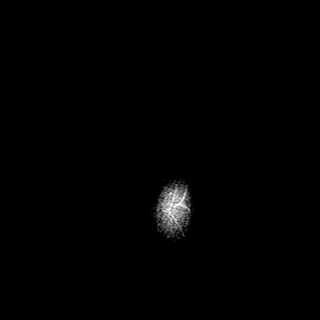

[Series 8: T2 · coronal · 5.0mm · 0.88mm/px · 1 of 24 slices shown (2 of 4)]
[im 1/24]
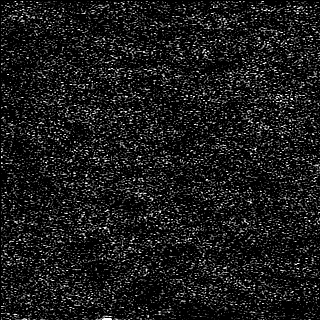

[Series 9: T2 · sagittal · 4.0mm · 1.17mm/px · 1 of 26 slices shown (3 of 4)]
[im 1/26]
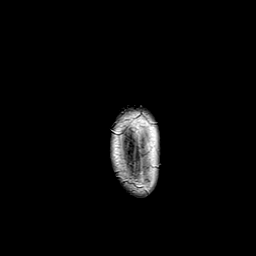

[Series 10: T2 · sagittal · 4.0mm · 1.17mm/px · 1 of 26 slices shown (4 of 4)]
[im 1/26]
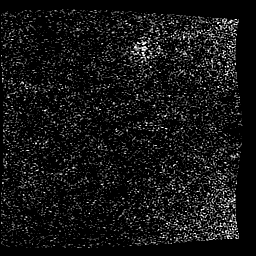

[Series 11: PD fat-sat · sagittal · 4.0mm · 1.17mm/px · 2 of 26 slices shown (3 of 8)]
[im 1/26]
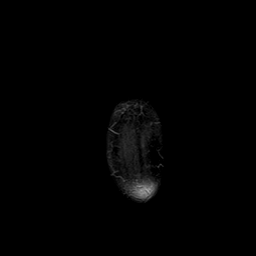
[im 26/26]
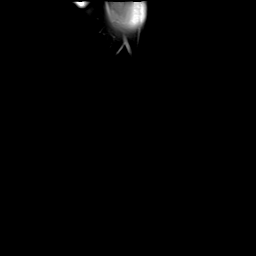

[Series 12: PD fat-sat · sagittal · 4.0mm · 1.17mm/px · 2 of 26 slices shown (4 of 8)]
[im 1/26]
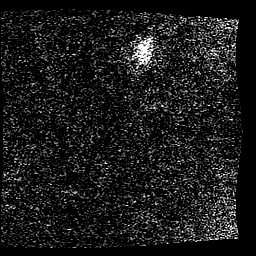
[im 26/26]
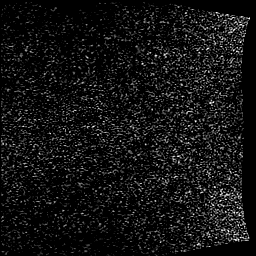

[Series 13: T2 fat-sat · axial · 5.0mm · 0.70mm/px · z∈[-255,+39]mm · 3 of 48 slices shown (1 of 2)]
[im 1/48]
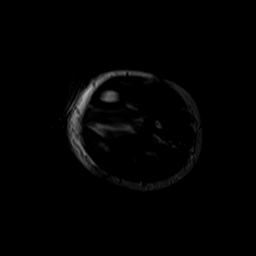
[im 24/48]
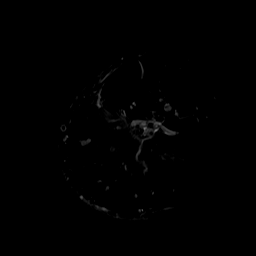
[im 48/48]
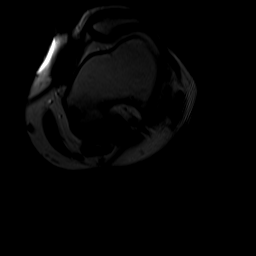

[Series 14: T2 fat-sat · axial · 5.0mm · 0.70mm/px · z∈[-405,-171]mm · 3 of 40 slices shown (2 of 2)]
[im 1/40]
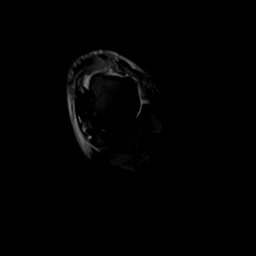
[im 20/40]
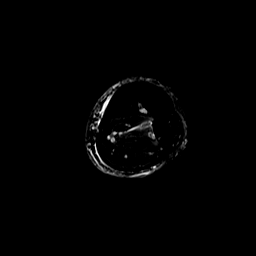
[im 40/40]
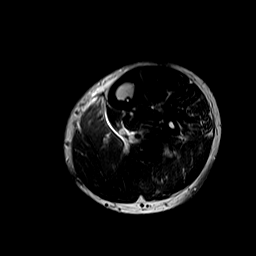

[Series 15: T1 · axial · 5.0mm · 0.70mm/px · z∈[-255,+39]mm · 3 of 50 slices shown (1 of 2)]
[im 1/50]
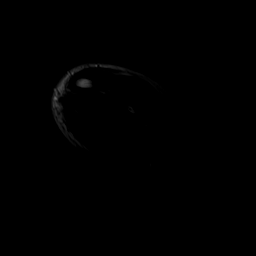
[im 25/50]
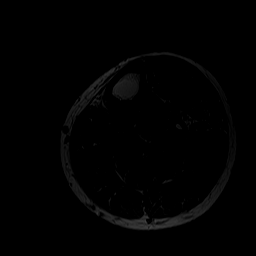
[im 50/50]
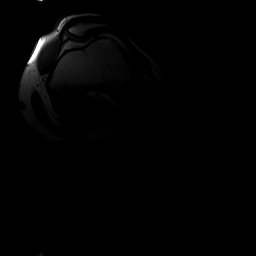

[Series 16: T1 · axial · 5.0mm · 0.70mm/px · z∈[-405,-171]mm · 3 of 40 slices shown (2 of 2)]
[im 1/40]
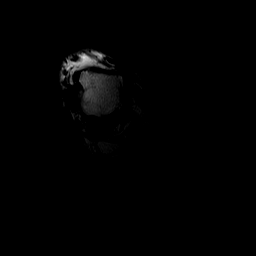
[im 20/40]
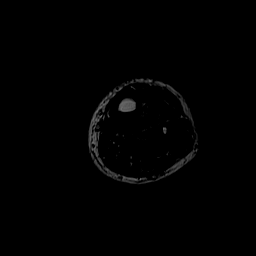
[im 40/40]
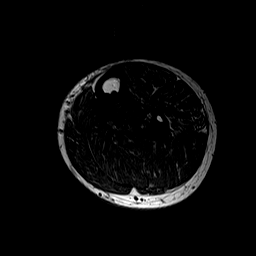

[Series 17: PD fat-sat · axial · 5.0mm · 0.70mm/px · z∈[-255,+39]mm · 3 of 50 slices shown (5 of 8)]
[im 1/50]
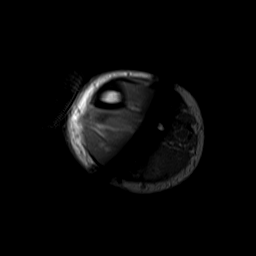
[im 25/50]
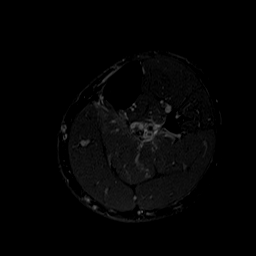
[im 50/50]
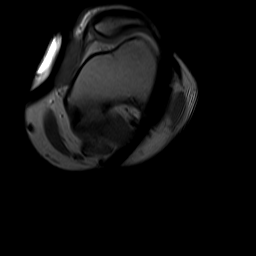

[Series 18: PD fat-sat · axial · 5.0mm · 0.70mm/px · z∈[-405,-171]mm · 3 of 40 slices shown (6 of 8)]
[im 1/40]
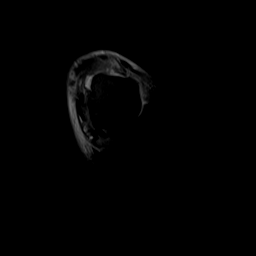
[im 20/40]
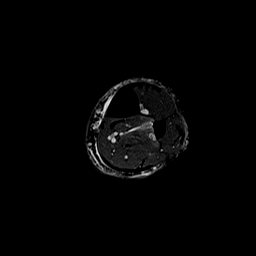
[im 40/40]
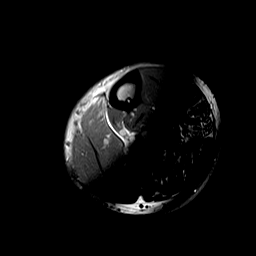

[Series 19: PD fat-sat · sagittal · 4.0mm · 1.17mm/px · 2 of 26 slices shown (7 of 8)]
[im 1/26]
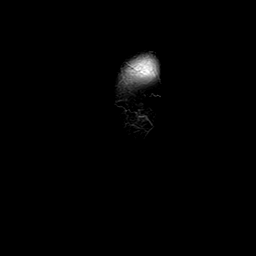
[im 26/26]
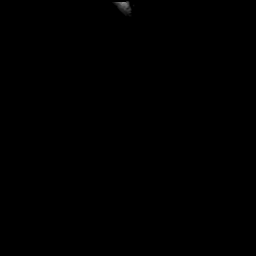

[Series 20: PD fat-sat · coronal · 5.0mm · 0.73mm/px · 2 of 24 slices shown (8 of 8)]
[im 1/24]
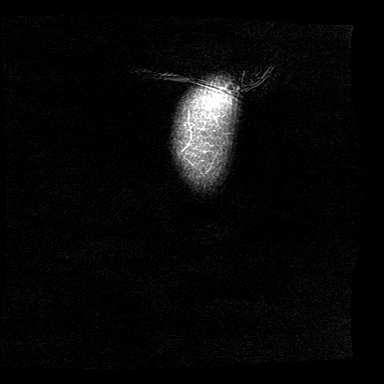
[im 24/24]
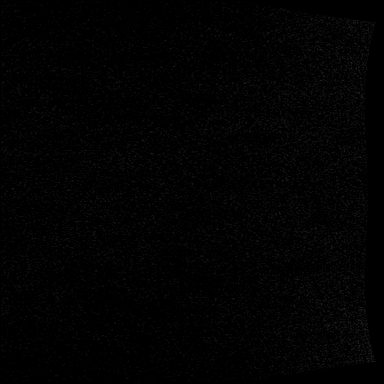

[Series 100: (id) · sagittal · 5.0mm · 1.17mm/px · 2 of 25 slices shown]
[im 1/25]
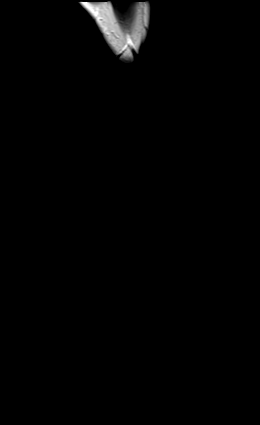
[im 25/25]
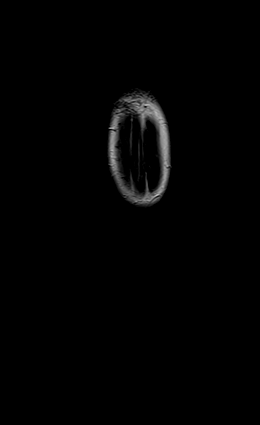

[Series 101: pdcor · coronal · 6.2mm · 0.73mm/px · 2 of 24 slices shown]
[im 1/24]
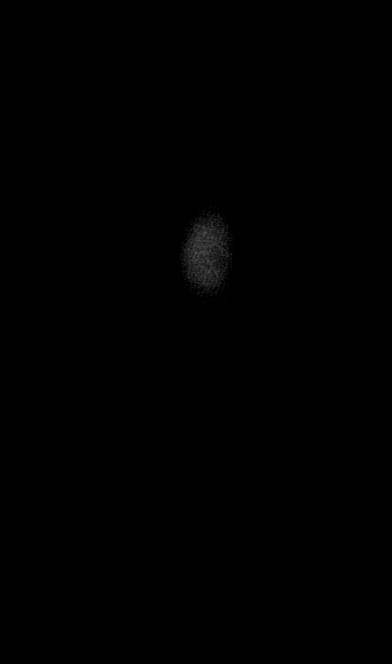
[im 24/24]
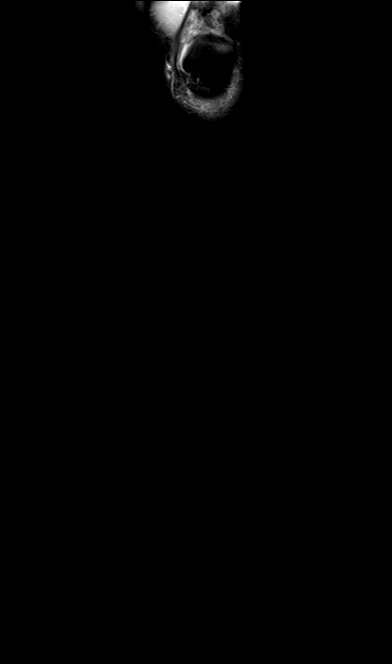

[Series 102: pdsag · sagittal · 5.0mm · 1.17mm/px · 2 of 25 slices shown]
[im 1/25]
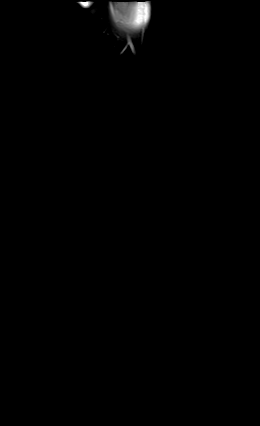
[im 25/25]
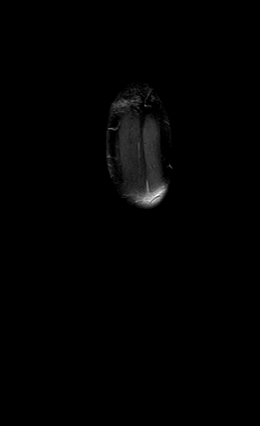

[38 of 40 positions shown; findings below may reference images not displayed]

FINDINGS: There is bone marrow edema in the proximal tibia laterally beneath the lateral tibial plateau and extending to the lateral edge of the bone.  No fracture can be identified.  Findings are consistent with bone contusion.  

The tibia and fibula otherwise appear normal.  No other abnormality identified.  

There are areas of soft tissue edema within the mid portion of the calf.  These involve portions of the soleus as well as the flexor hallucis longus and flexor digitorum muscles.  No specific muscle or tendon tear can be identified.  

Other soft tissues of the leg appear normal.  

No other abnormality can be identified.
IMPRESSION: 1. Bone contusion proximal tibia.  There is bone marrow edema within the proximal tibia laterally beneath the lateral tibial plateau and extending laterally to the edge of the bone.  No fracture can be identified.  

2. Soft tissue contusion with areas of edema within several muscles of the calf.  There is some edema within portions of the soleus as well as the flexor hallucis longus and flexor digitorum muscles without evidence of specific muscle or tendon tear. 

3. No other abnormality identified.

## 2021-02-21 IMAGING — MR MRI KNEE LT W/O CONTRAST
5 of 6 series · 37 of 40 positions shown · non-contrast
Comparison: none

﻿Pertinent Hx:  Fell.  Knee pain.  Injury 01/29/2021.
TECHNIQUE: Proton density and T2-weighted sagittal images of the knee were taken.  Proton density coronal and axial images were also obtained.

[Series 3: PD fat-sat · axial · 3.0mm · 0.50mm/px · z∈[-86,+62]mm · 9 of 38 slices shown (1 of 3)]
[im 1/38]
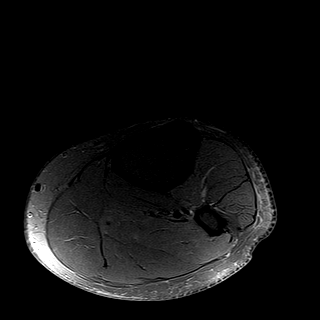
[im 5/38]
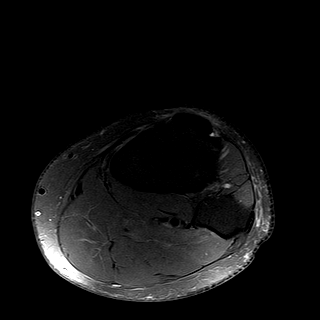
[im 10/38]
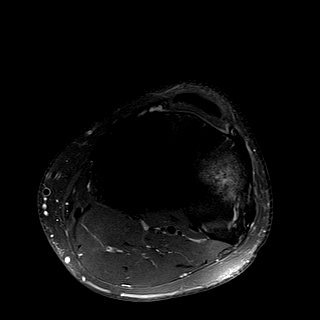
[im 14/38]
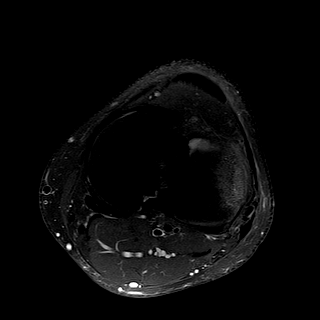
[im 19/38]
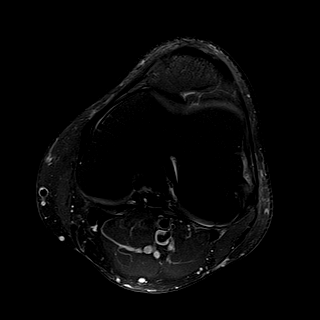
[im 24/38]
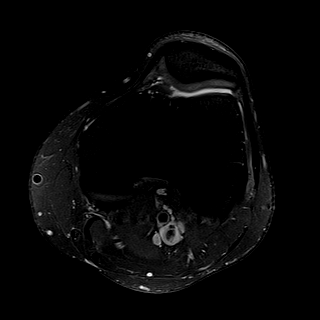
[im 28/38]
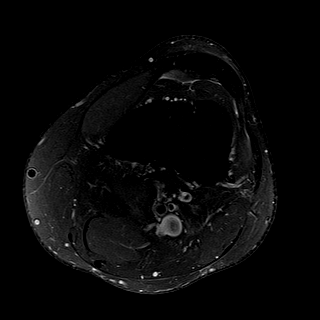
[im 33/38]
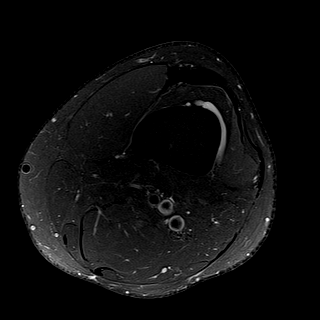
[im 38/38]
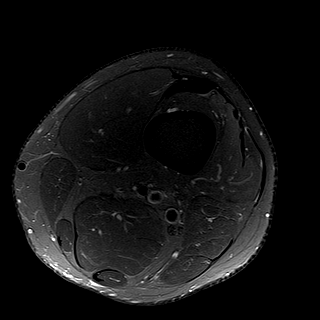

[Series 4: PD fat-sat · sagittal · 3.0mm · 0.50mm/px · 7 of 28 slices shown (2 of 3)]
[im 1/28]
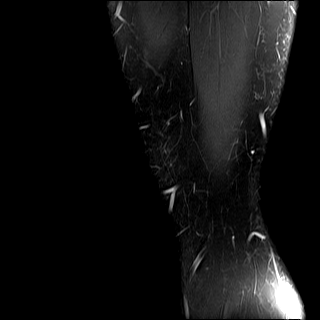
[im 5/28]
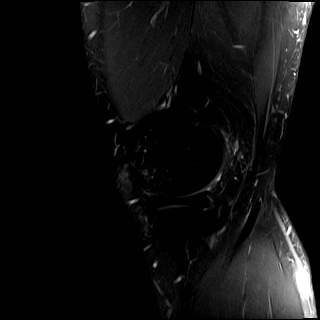
[im 10/28]
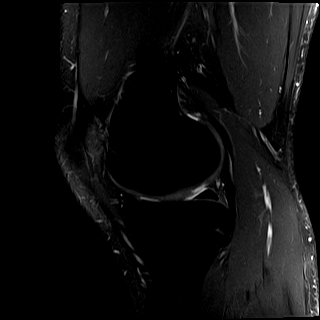
[im 14/28]
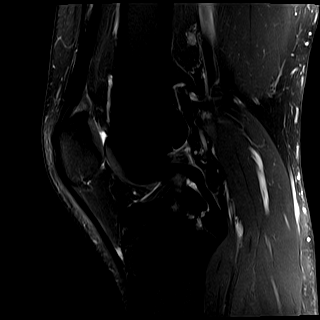
[im 19/28]
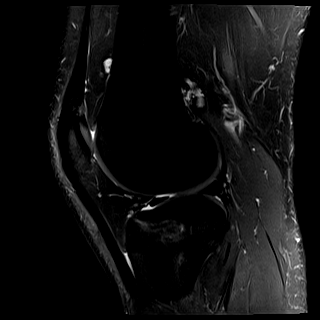
[im 23/28]
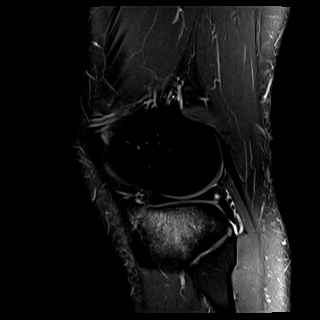
[im 28/28]
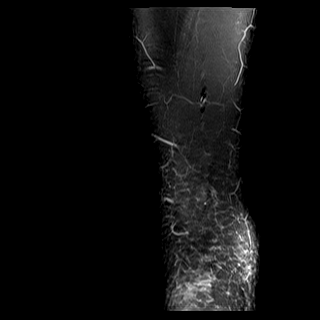

[Series 5: T2 · sagittal · 3.0mm · 0.42mm/px · 7 of 28 slices shown]
[im 1/28]
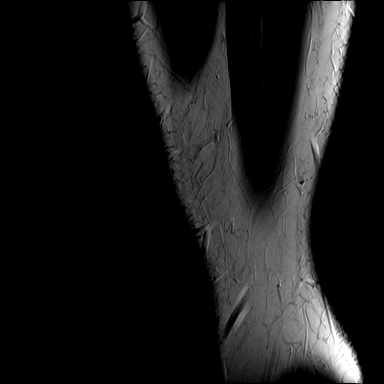
[im 5/28]
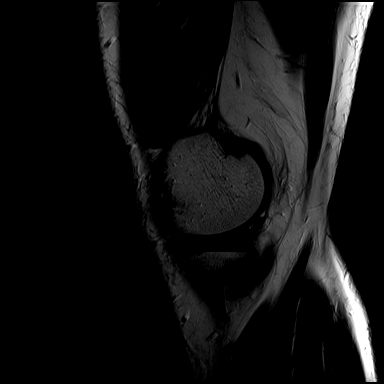
[im 10/28]
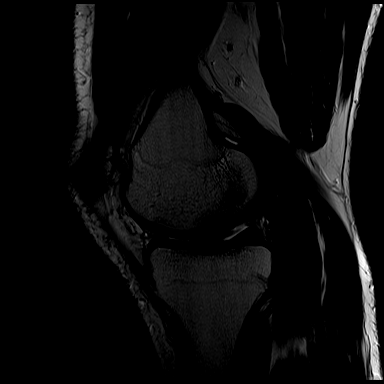
[im 14/28]
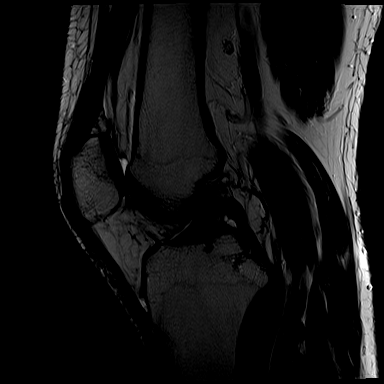
[im 19/28]
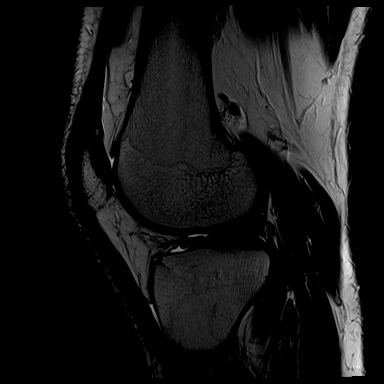
[im 23/28]
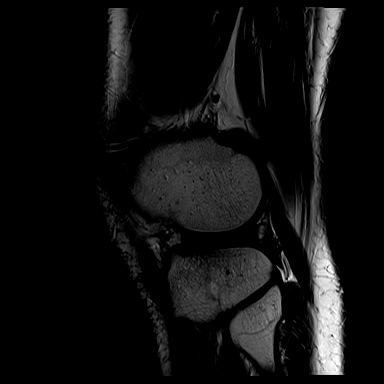
[im 28/28]
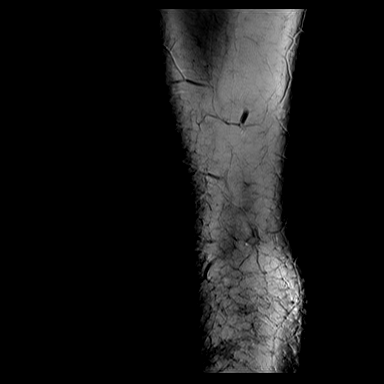

[Series 6: PD · sagittal · 3.0mm · 0.50mm/px · 7 of 28 slices shown]
[im 1/28]
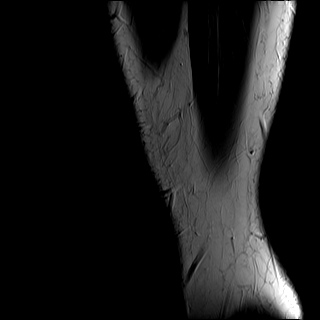
[im 5/28]
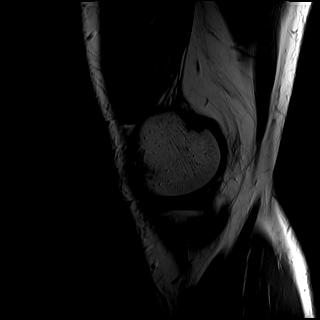
[im 10/28]
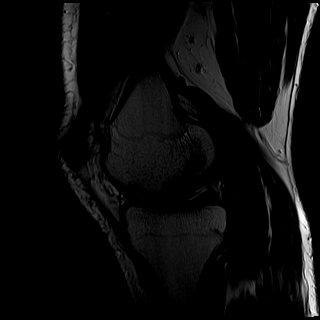
[im 14/28]
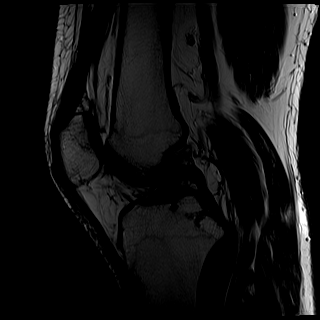
[im 19/28]
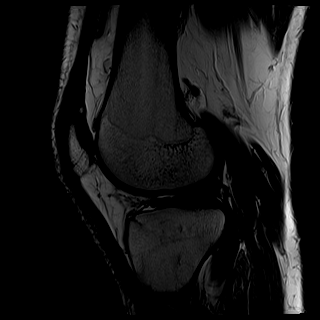
[im 23/28]
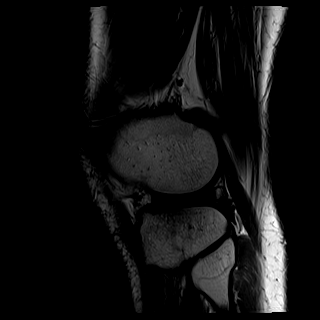
[im 28/28]
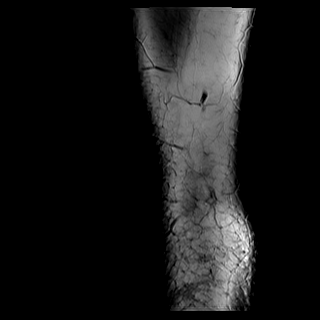

[Series 7: PD fat-sat · coronal · 3.0mm · 0.50mm/px · 7 of 31 slices shown (3 of 3)]
[im 1/31]
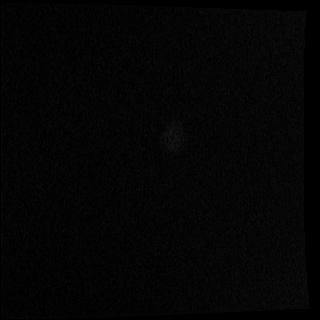
[im 6/31]
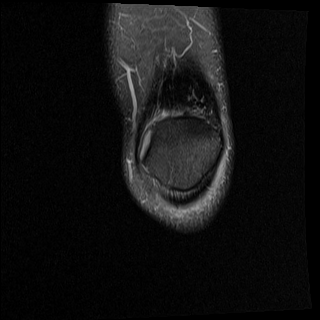
[im 11/31]
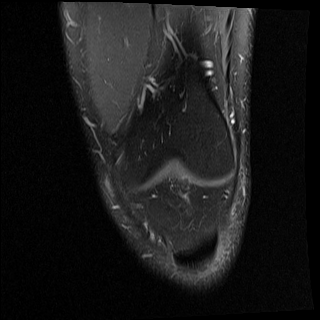
[im 16/31]
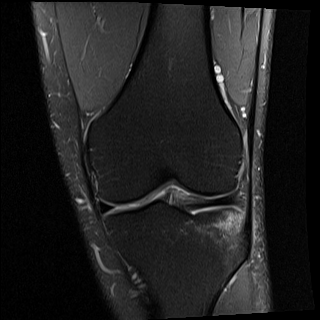
[im 21/31]
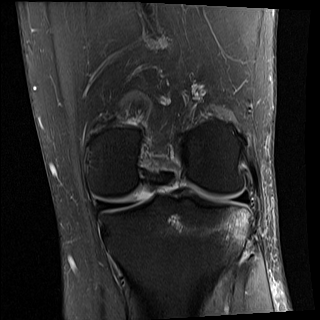
[im 26/31]
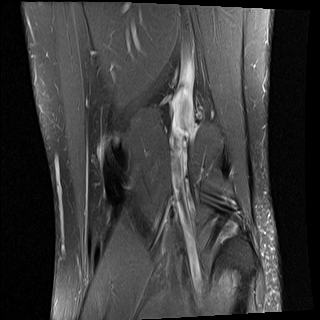
[im 31/31]
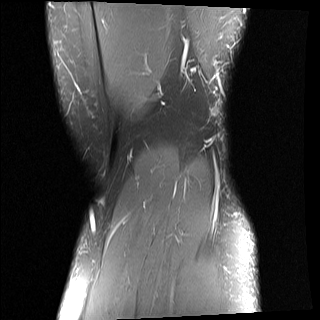

[37 of 40 positions shown; findings below may reference images not displayed]

FINDINGS: There is bone marrow edema within the proximal tibia laterally.  This extends beneath the tibial plateau to the lateral edge of the bone.  There is no fracture.  This is consistent with bone contusion.

Bones otherwise appear normal.  Bone marrow signal is otherwise normal.  

There is no joint effusion.  

Medial and lateral menisci are normal.  No tear.  

Anterior and posterior cruciate ligaments are normal.  

Collateral ligaments are normal.  

Muscles and tendons in the popliteal space are normal.  

Distal quadriceps tendon and the patellar tendon are normal.
IMPRESSION: 1. Bone contusion proximal tibia laterally.  Bone marrow edema is present without evidence of fracture.  

2. No other abnormality identified.

## 2021-05-03 ENCOUNTER — Inpatient Hospital Stay
Admit: 2021-05-03 | Discharge: 2021-05-03 | Disposition: A | Discharge status: Home / Self Care | Payer: BLUE CROSS/BLUE SHIELD

## 2021-05-03 ENCOUNTER — Emergency Department: Admit: 2021-05-03 | Payer: BLUE CROSS/BLUE SHIELD | Primary: Family

## 2021-05-03 DIAGNOSIS — R109 Unspecified abdominal pain: Secondary | ICD-10-CM

## 2021-05-03 DIAGNOSIS — M545 Low back pain, unspecified: Secondary | ICD-10-CM

## 2021-05-03 LAB — CBC WITH AUTO DIFFERENTIAL
Basophils %: 0.1 % (ref 0.0–2.0)
Basophils Absolute: 0.01 E9/L (ref 0.00–0.20)
Eosinophils %: 0 % (ref 0.0–6.0)
Eosinophils Absolute: 0 E9/L — ABNORMAL LOW (ref 0.05–0.50)
Hematocrit: 43.8 % (ref 37.0–54.0)
Hemoglobin: 14.5 g/dL (ref 12.5–16.5)
Immature Granulocytes #: 0.05 E9/L
Immature Granulocytes %: 0.4 % (ref 0.0–5.0)
Lymphocytes %: 8 % — ABNORMAL LOW (ref 20.0–42.0)
Lymphocytes Absolute: 0.95 E9/L — ABNORMAL LOW (ref 1.50–4.00)
MCH: 30.1 pg (ref 26.0–35.0)
MCHC: 33.1 % (ref 32.0–34.5)
MCV: 91.1 fL (ref 80.0–99.9)
MPV: 8.7 fL (ref 7.0–12.0)
Monocytes %: 6.2 % (ref 2.0–12.0)
Monocytes Absolute: 0.74 E9/L (ref 0.10–0.95)
Neutrophils %: 85.3 % — ABNORMAL HIGH (ref 43.0–80.0)
Neutrophils Absolute: 10.14 E9/L — ABNORMAL HIGH (ref 1.80–7.30)
Platelets: 222 E9/L (ref 130–450)
RBC: 4.81 E12/L (ref 3.80–5.80)
RDW: 12.5 fL (ref 11.5–15.0)
WBC: 11.9 E9/L — ABNORMAL HIGH (ref 4.5–11.5)

## 2021-05-03 LAB — COMPREHENSIVE METABOLIC PANEL W/ REFLEX TO MG FOR LOW K
ALT: 25 U/L (ref 0–40)
AST: 21 U/L (ref 0–39)
Albumin: 4.2 g/dL (ref 3.5–5.2)
Alkaline Phosphatase: 79 U/L (ref 40–129)
Anion Gap: 11 mmol/L (ref 7–16)
BUN: 12 mg/dL (ref 6–20)
CO2: 25 mmol/L (ref 22–29)
Calcium: 9 mg/dL (ref 8.6–10.2)
Chloride: 95 mmol/L — ABNORMAL LOW (ref 98–107)
Creatinine: 1 mg/dL (ref 0.7–1.2)
Est, Glom Filt Rate: >60 mL/min/{1.73_m2} (ref >=60)
Glucose: 149 mg/dL — ABNORMAL HIGH (ref 74–99)
Potassium reflex Magnesium: 4.2 mmol/L (ref 3.5–5.0)
Sodium: 131 mmol/L — ABNORMAL LOW (ref 132–146)
Total Bilirubin: 2.1 mg/dL — ABNORMAL HIGH (ref 0.0–1.2)
Total Protein: 7.4 g/dL (ref 6.4–8.3)

## 2021-05-03 LAB — URINALYSIS
Bilirubin Urine: NEGATIVE
Glucose, Ur: NEGATIVE mg/dL
Ketones, Urine: NEGATIVE mg/dL
Leukocyte Esterase, Urine: NEGATIVE
Nitrite, Urine: NEGATIVE
Specific Gravity, UA: >=1.03 (ref 1.005–1.030)
Urobilinogen, Urine: 0.2 E.U./dL (ref <2.0)
pH, UA: 5.5 (ref 5.0–9.0)

## 2021-05-03 LAB — MICROSCOPIC URINALYSIS

## 2021-05-03 LAB — LACTIC ACID: Lactic Acid: 1.7 mmol/L (ref 0.5–2.2)

## 2021-05-03 MED ORDER — KETOROLAC TROMETHAMINE 10 MG PO TABS
10 MG | ORAL_TABLET | Freq: Four times a day (QID) | ORAL | 0 refills | Status: AC | PRN
Start: 2021-05-03 — End: 2021-05-08

## 2021-05-03 MED ORDER — IOPAMIDOL 76 % IV SOLN
76 % | Freq: Once | INTRAVENOUS | Status: AC | PRN
Start: 2021-05-03 — End: 2021-05-03
  Administered 2021-05-03: 19:00:00 75 mL via INTRAVENOUS

## 2021-05-03 MED ORDER — KETOROLAC TROMETHAMINE 30 MG/ML IJ SOLN
30 MG/ML | Freq: Once | INTRAMUSCULAR | Status: AC
Start: 2021-05-03 — End: 2021-05-03
  Administered 2021-05-03: 19:00:00 30 mg via INTRAVENOUS

## 2021-05-03 MED ORDER — SODIUM CHLORIDE 0.9 % IV BOLUS
0.9 % | Freq: Once | INTRAVENOUS | Status: AC
Start: 2021-05-03 — End: 2021-05-03
  Administered 2021-05-03: 19:00:00 1000 mL via INTRAVENOUS

## 2021-05-03 MED ORDER — NORMAL SALINE FLUSH 0.9 % IV SOLN
0.9 % | INTRAVENOUS | Status: AC | PRN
Start: 2021-05-03 — End: 2021-05-03
  Administered 2021-05-03: 19:00:00 10 mL via INTRAVENOUS

## 2021-05-03 MED ORDER — CYCLOBENZAPRINE HCL 10 MG PO TABS
10 MG | ORAL_TABLET | Freq: Three times a day (TID) | ORAL | 0 refills | Status: AC | PRN
Start: 2021-05-03 — End: 2021-05-13

## 2021-05-03 MED FILL — KETOROLAC TROMETHAMINE 30 MG/ML IJ SOLN: 30 MG/ML | INTRAMUSCULAR | Qty: 1

## 2021-05-03 NOTE — ED Provider Notes (Signed)
Independent APP Visit.             [facility] EMERGENCY DEPARTMENT  ED  Encounter Note  Admit Date/RoomTime: 05/03/2021  2:18 PM  ED Room: 31/31  NAME: Henry Edwards  DOB: [birth year]  MRN: [id number]  PCP: Yaakov Guthrie, APRN - CNP    CHIEF COMPLAINT     No chief complaint on file.    HISTORY OF PRESENT ILLNESS        IRA Edwards is a 36 y.o. male who presents to the ED by private vehicle for bilateral flank pain, beginning 2 day(s) ago. The complaint has been persistent and are moderate in severity.  The patient states that he started with bilateral flank pain out of the blue few days ago.   States the pain has been constant.   No fall or trauma.   Reports he feels well otherwise.   Denies nausea, vomiting or diarrhea.   Reports urine is dark.   No hx of stone or prior infection.    The patient reports he is otherwise eating and drinking well.  Describes pain as "heavy" the pain is constant, even at rest.  Denies urinary burning or frequency.  No penile discharge.  States that he has no concern for STDs.             REVIEW OF SYSTEMS     Pertinent positives and negatives are stated within HPI, all other systems reviewed and are negative.    Past Medical History:  has a past medical history of Rotator cuff disorder, left.  Surgical History:  has a past surgical history that includes Total shoulder arthroplasty (Left).  Social History:  reports that he has been smoking cigarettes. He has been smoking an average of 1.5 packs per day. He has never used smokeless tobacco. He reports that he does not currently use alcohol. He reports that he does not use drugs.  Family History: family history is not on file.   Allergies: Patient has no known allergies.  CURRENT MEDICATIONS       Discharge Medication List as of 05/03/2021  4:55 PM        CONTINUE these medications which have NOT CHANGED    Details   ondansetron (ZOFRAN-ODT) 4 MG disintegrating tablet Take 1 tablet by mouth 3  times daily as needed for Nausea or Vomiting, Disp-21 tablet, R-0Print      acetaminophen (TYLENOL) 325 MG tablet Take 1 tablet by mouth as neededHistorical Med      Cyanocobalamin (VITAMIN B 12 PO) Take 500 mcg by mouth dailyHistorical Med      vitamin D (CHOLECALCIFEROL) 1000 UNIT TABS tablet Take 1 tablet by mouth dailyHistorical Med      MENTHOL-METHYL SALICYLATE EX Apply topically 2 times daily as neededHistorical Med             SCREENINGS     Glasgow Coma Scale  Eye Opening: Spontaneous  Best Verbal Response: Oriented  Best Motor Response: Obeys commands  Glasgow Coma Scale Score: 15         CIWA Assessment  BP: 116/77  Heart Rate: 88       PHYSICAL EXAM   Oxygen Saturation Interpretation: Normal on room air analysis.        ED Triage Vitals [05/03/21 1416]   BP Temp Temp Source Heart Rate Resp SpO2 Height Weight   135/84 99.7 ??F (37.6 ??C) Oral 99 20 96 % 5\' 10"  (1.778  m) 180 lb (81.6 kg)         Physical Exam  Constitutional/General: Alert and oriented x3, well appearing, non toxic  HEENT:  NC/NT. PERRLA,  Airway patent.  Neck: Supple, full ROM, non tender to palpation in the midline, no stridor, no crepitus, no meningeal signs  Respiratory: Lungs clear to auscultation bilaterally, no wheezes, rales, or rhonchi. Not in respiratory distress  CV:  Regular rate. Regular rhythm. No murmurs, gallops, or rubs. 2+ distal pulses  Chest: No chest wall tenderness  GI:  Abdomen Soft, Non distended.  +BS.  Bilateral flank pain to palpation.  Mildly uncomfortable in upper abdomen as well.    No rebound, guarding, or rigidity. No pulsatile masses.  Musculoskeletal: Moves all extremities x 4. Warm and well perfused, no clubbing, cyanosis, or edema. Capillary refill <3 seconds  Integument: skin warm and dry. No rashes.   Lymphatic: no lymphadenopathy noted  Neurologic: GCS 15, no focal deficits, symmetric strength 5/5 in the upper and lower extremities bilaterally  Psychiatric: Normal Affect    DIAGNOSTIC RESULTS   (All  laboratory and radiology results have been personally reviewed by myself)  Labs:  Results for orders placed or performed during the hospital encounter of 05/03/21   CBC with Auto Differential   Result Value Ref Range    WBC 11.9 (H) 4.5 - 11.5 E9/L    RBC 4.81 3.80 - 5.80 E12/L    Hemoglobin 14.5 12.5 - 16.5 g/dL    Hematocrit 49.4 49.6 - 54.0 %    MCV 91.1 80.0 - 99.9 fL    MCH 30.1 26.0 - 35.0 pg    MCHC 33.1 32.0 - 34.5 %    RDW 12.5 11.5 - 15.0 fL    Platelets 222 130 - 450 E9/L    MPV 8.7 7.0 - 12.0 fL    Neutrophils % 85.3 (H) 43.0 - 80.0 %    Immature Granulocytes % 0.4 0.0 - 5.0 %    Lymphocytes % 8.0 (L) 20.0 - 42.0 %    Monocytes % 6.2 2.0 - 12.0 %    Eosinophils % 0.0 0.0 - 6.0 %    Basophils % 0.1 0.0 - 2.0 %    Neutrophils Absolute 10.14 (H) 1.80 - 7.30 E9/L    Immature Granulocytes # 0.05 E9/L    Lymphocytes Absolute 0.95 (L) 1.50 - 4.00 E9/L    Monocytes Absolute 0.74 0.10 - 0.95 E9/L    Eosinophils Absolute 0.00 (L) 0.05 - 0.50 E9/L    Basophils Absolute 0.01 0.00 - 0.20 E9/L   Comprehensive Metabolic Panel w/ Reflex to MG   Result Value Ref Range    Sodium 131 (L) 132 - 146 mmol/L    Potassium reflex Magnesium 4.2 3.5 - 5.0 mmol/L    Chloride 95 (L) 98 - 107 mmol/L    CO2 25 22 - 29 mmol/L    Anion Gap 11 7 - 16 mmol/L    Glucose 149 (H) 74 - 99 mg/dL    BUN 12 6 - 20 mg/dL    Creatinine 1.0 0.7 - 1.2 mg/dL    Est, Glom Filt Rate >60 >=60 mL/min/1.73    Calcium 9.0 8.6 - 10.2 mg/dL    Total Protein 7.4 6.4 - 8.3 g/dL    Albumin 4.2 3.5 - 5.2 g/dL    Total Bilirubin 2.1 (H) 0.0 - 1.2 mg/dL    Alkaline Phosphatase 79 40 - 129 U/L    ALT 25 0 - 40 U/L  AST 21 0 - 39 U/L   Lactic Acid   Result Value Ref Range    Lactic Acid 1.7 0.5 - 2.2 mmol/L   Urinalysis   Result Value Ref Range    Color, UA Yellow Straw/Yellow    Clarity, UA SL CLOUDY Clear    Glucose, Ur Negative Negative mg/dL    Bilirubin Urine Negative Negative    Ketones, Urine Negative Negative mg/dL    Specific Gravity, UA >=1.030 1.005 -  1.030    Blood, Urine SMALL (A) Negative    pH, UA 5.5 5.0 - 9.0    Protein, UA TRACE Negative mg/dL    Urobilinogen, Urine 0.2 <2.0 E.U./dL    Nitrite, Urine Negative Negative    Leukocyte Esterase, Urine Negative Negative   Microscopic Urinalysis   Result Value Ref Range    Mucus, UA Present (A) None Seen /LPF    WBC, UA NONE 0 - 5 /HPF    RBC, UA NONE 0 - 2 /HPF    Bacteria, UA MODERATE (A) None Seen /HPF     Imaging:  All Radiology results interpreted by Radiologist unless otherwise noted.  CT ABDOMEN PELVIS W IV CONTRAST Additional Contrast? None   Final Result   No acute abnormality is seen in the abdomen or the pelvis.             ED COURSE   Vitals:    Vitals:    05/03/21 1416 05/03/21 1648   BP: 135/84 116/77   Pulse: 99 88   Resp: 20 16   Temp: 99.7 ??F (37.6 ??C)    TempSrc: Oral    SpO2: 96% 98%   Weight: 180 lb (81.6 kg)    Height:  (1.778 m)        Patient was given the following medications:  Medications   0.9 % sodium chloride bolus (0 mLs IntraVENous Stopped 05/03/21 1657)   ketorolac (TORADOL) injection 30 mg (30 mg IntraVENous Given 05/03/21 1445)   iopamidol (ISOVUE-370) 76 % injection 75 mL (75 mLs IntraVENous Given 05/03/21 1518)   sodium chloride flush 0.9 % injection 10 mL (10 mLs IntraVENous Given 05/03/21 1515)          PROCEDURES   none      CONSULTS:  None  DIFFERENTIAL DX_MDM   MDM:   Social Determinants : None    Records Reviewed : None_ n/a per encounter visit    CC/HPI Summary, DDx, ED Course, and Reassessment: Patient presents with No chief complaint on file.  The patient is here today with bilateral flank and lumbar pain.  His urinalysis showed trace blood but no other abnormality or sign of infection.  Labs were completely normal including CBC, CMP and lactic acid.  CAT scan of the abdomen and pelvis was normal as well.  No evidence of stone, obstruction or other intra-abdominal pathology.  All of these results were discussed with the patient.  This could in fact just be some  musculoskeletal pain.  He is reporting significant improvement after the Toradol.  I am going to send a prescription for Toradol and Flexeril to the pharmacy for him.  I do suggest that he follow-up with his PCP to discuss anything further especially if he has ongoing symptoms.  He is aware and agreeable to this plan      Plan of Care/Counseling:  Eartha Inch, PA-C reviewed today's visit with the patient in addition to providing specific details for the plan of care and  counseling regarding the diagnosis and prognosis.  Questions are answered at this time and are agreeable with the plan.    ASSESSMENT     1. Bilateral flank pain    2. Acute bilateral low back pain without sciatica        DISPOSITION   Discharged home.  Patient condition is stable    Discharge Instructions:   Patient referred to  Yaakov Guthriehristopher M Broderick, APRN - CNP  [address]  [place] [id number]  [phone]        NEW MEDICATIONS     Discharge Medication List as of 05/03/2021  4:55 PM        START taking these medications    Details   ketorolac (TORADOL) 10 MG tablet Take 1 tablet by mouth every 6 hours as needed for Pain, Disp-20 tablet, R-0Normal      cyclobenzaprine (FLEXERIL) 10 MG tablet Take 1 tablet by mouth 3 times daily as needed for Muscle spasms, Disp-21 tablet, R-0Normal           Electronically signed by Eartha Inchindy A Hulen Mandler, PA-C   DD: 05/03/21  **This report was transcribed using voice recognition software. Every effort was made to ensure accuracy; however, inadvertent computerized transcription errors may be present.  END OF ED PROVIDER NOTE      Eartha Inchindy A Almeta Geisel, PA-C  05/03/21 1820

## 2022-10-29 ENCOUNTER — Encounter

## 2022-11-28 ENCOUNTER — Inpatient Hospital Stay: Admit: 2022-11-28 | Payer: PRIVATE HEALTH INSURANCE | Primary: Family

## 2022-11-28 ENCOUNTER — Encounter

## 2022-11-28 DIAGNOSIS — R52 Pain, unspecified: Secondary | ICD-10-CM

## 2022-11-28 DIAGNOSIS — M25511 Pain in right shoulder: Secondary | ICD-10-CM

## 2022-11-28 DIAGNOSIS — M25561 Pain in right knee: Secondary | ICD-10-CM

## 2022-11-28 DIAGNOSIS — M25562 Pain in left knee: Secondary | ICD-10-CM

## 2022-11-28 MED ORDER — GADOTERIDOL 279.3 MG/ML IV SOLN
279.3 | Freq: Once | INTRAVENOUS | Status: AC | PRN
Start: 2022-11-28 — End: 2022-11-28
  Administered 2022-11-28: 16:00:00 0.1 mL

## 2022-11-28 MED ORDER — IOPAMIDOL 61 % IV SOLN
61 | Freq: Once | INTRAVENOUS | Status: AC | PRN
Start: 2022-11-28 — End: 2022-11-28
  Administered 2022-11-28: 16:00:00 1 mL

## 2022-11-28 MED ORDER — LIDOCAINE HCL (PF) 1 % IJ SOLN
1 | INTRAMUSCULAR | Status: AC | PRN
Start: 2022-11-28 — End: 2022-11-28
  Administered 2022-11-28: 15:00:00 5 via INTRADERMAL

## 2022-11-28 NOTE — OR Nursing (Addendum)
Patient arrived from home to interventional radiology for right shoulder arthrogram. Saintclair Halsted, PA-C in to speak to the patient prior to the procedure, all questions/concerns addressed. Consent signed per patient. Patient connected to monitoring equipment and vitals obtained prior to the procedure. Patient positioned supine on table, right shoulder prepped and draped. Using fluoroscopy guidance, needle inserted. Contrast injected, needle removed @ 1132. Site cleansed and band aid applied. Vitals checked post procedure. Patient tolerated procedure well. Discharge instructions given, patient taken to MRI.

## 2022-11-28 NOTE — Op Note (Signed)
Procedure: Fluoroscopic Guided Right Shoulder Arthrogram    Diagnosis: pain    Findings: Successful intraarticular injection. Approximately 9 cc of a mixture of 0.1cc ProHance, 15cc normal saline and 5cc Isovue 300.      Specimen: None.    Anesthesia: Local infiltration of Lidocaine 1% 4ml without epinephrine     EBL: Minimal.    Complication: None immediately post procedure. Patient tolerated procedure well.     Plan: Further imaging in the MRI suite then discharge to home.     Comments:    See radiology dictation in PACs for image review and additional procedural information.
